# Patient Record
Sex: Female | Born: 1970 | Race: White | Hispanic: No | Smoking: Former smoker
Health system: Southern US, Community
[De-identification: ages and names within clinical notes are randomized; demographics above are authoritative.]

## PROBLEM LIST (undated history)

## (undated) DIAGNOSIS — Z8639 Personal history of other endocrine, nutritional and metabolic disease: Secondary | ICD-10-CM

## (undated) DIAGNOSIS — F988 Other specified behavioral and emotional disorders with onset usually occurring in childhood and adolescence: Secondary | ICD-10-CM

## (undated) DIAGNOSIS — F419 Anxiety disorder, unspecified: Secondary | ICD-10-CM

## (undated) DIAGNOSIS — M5412 Radiculopathy, cervical region: Secondary | ICD-10-CM

## (undated) HISTORY — DX: Other specified behavioral and emotional disorders with onset usually occurring in childhood and adolescence: F98.8

## (undated) HISTORY — DX: Radiculopathy, cervical region: M54.12

## (undated) HISTORY — DX: Anxiety disorder, unspecified: F41.9

## (undated) HISTORY — DX: Personal history of other endocrine, nutritional and metabolic disease: Z86.39

## (undated) HISTORY — PX: BREAST SURGERY: SHX581

---

## 2003-07-08 HISTORY — PX: BREAST SURGERY: SHX581

## 2011-05-02 ENCOUNTER — Emergency Department (HOSPITAL_COMMUNITY)
Admission: EM | Admit: 2011-05-02 | Discharge: 2011-05-02 | Disposition: A | Discharge status: Home / Self Care | Payer: Self-pay | Attending: Emergency Medicine | Admitting: Emergency Medicine

## 2011-05-02 DIAGNOSIS — R4589 Other symptoms and signs involving emotional state: Secondary | ICD-10-CM | POA: Insufficient documentation

## 2011-05-02 DIAGNOSIS — Z046 Encounter for general psychiatric examination, requested by authority: Secondary | ICD-10-CM | POA: Insufficient documentation

## 2011-05-02 LAB — CBC
MCH: 30.3 pg (ref 26.0–34.0)
Platelets: 256 10*3/uL (ref 150–400)
RBC: 3.9 MIL/uL (ref 3.87–5.11)
WBC: 7.4 10*3/uL (ref 4.0–10.5)

## 2011-05-02 LAB — COMPREHENSIVE METABOLIC PANEL
ALT: 12 U/L (ref 0–35)
AST: 19 U/L (ref 0–37)
Albumin: 3.6 g/dL (ref 3.5–5.2)
Chloride: 111 mEq/L (ref 96–112)
Creatinine, Ser: 0.58 mg/dL (ref 0.50–1.10)
Sodium: 145 mEq/L (ref 135–145)
Total Bilirubin: 0.1 mg/dL — ABNORMAL LOW (ref 0.3–1.2)

## 2011-05-02 LAB — RAPID URINE DRUG SCREEN, HOSP PERFORMED
Barbiturates: NOT DETECTED
Benzodiazepines: NOT DETECTED
Cocaine: NOT DETECTED
Tetrahydrocannabinol: NOT DETECTED

## 2011-05-02 LAB — ETHANOL: Alcohol, Ethyl (B): 176 mg/dL — ABNORMAL HIGH (ref 0–11)

## 2011-05-02 LAB — DIFFERENTIAL
Basophils Absolute: 0.1 10*3/uL (ref 0.0–0.1)
Basophils Relative: 2 % — ABNORMAL HIGH (ref 0–1)
Eosinophils Absolute: 0.9 10*3/uL — ABNORMAL HIGH (ref 0.0–0.7)
Monocytes Relative: 4 % (ref 3–12)
Neutro Abs: 3.7 10*3/uL (ref 1.7–7.7)
Neutrophils Relative %: 50 % (ref 43–77)

## 2011-07-12 ENCOUNTER — Encounter: Payer: Self-pay | Admitting: *Deleted

## 2011-07-12 ENCOUNTER — Emergency Department (INDEPENDENT_AMBULATORY_CARE_PROVIDER_SITE_OTHER): Payer: BC Managed Care – PPO

## 2011-07-12 ENCOUNTER — Other Ambulatory Visit: Payer: Self-pay

## 2011-07-12 ENCOUNTER — Emergency Department (HOSPITAL_BASED_OUTPATIENT_CLINIC_OR_DEPARTMENT_OTHER)
Admission: EM | Admit: 2011-07-12 | Discharge: 2011-07-12 | Disposition: A | Discharge status: Home / Self Care | Payer: BC Managed Care – PPO | Attending: Emergency Medicine | Admitting: Emergency Medicine

## 2011-07-12 DIAGNOSIS — R079 Chest pain, unspecified: Secondary | ICD-10-CM | POA: Insufficient documentation

## 2011-07-12 DIAGNOSIS — R0789 Other chest pain: Secondary | ICD-10-CM

## 2011-07-12 DIAGNOSIS — R209 Unspecified disturbances of skin sensation: Secondary | ICD-10-CM

## 2011-07-12 DIAGNOSIS — F17200 Nicotine dependence, unspecified, uncomplicated: Secondary | ICD-10-CM | POA: Insufficient documentation

## 2011-07-12 DIAGNOSIS — F410 Panic disorder [episodic paroxysmal anxiety] without agoraphobia: Secondary | ICD-10-CM | POA: Insufficient documentation

## 2011-07-12 LAB — BASIC METABOLIC PANEL
Calcium: 10 mg/dL (ref 8.4–10.5)
Chloride: 104 mEq/L (ref 96–112)
Creatinine, Ser: 0.7 mg/dL (ref 0.50–1.10)
GFR calc Af Amer: >90 mL/min (ref >90)

## 2011-07-12 LAB — DIFFERENTIAL
Basophils Absolute: 0.1 10*3/uL (ref 0.0–0.1)
Lymphocytes Relative: 25 % (ref 12–46)
Neutro Abs: 6 10*3/uL (ref 1.7–7.7)
Neutrophils Relative %: 63 % (ref 43–77)

## 2011-07-12 LAB — CBC
Platelets: 354 10*3/uL (ref 150–400)
RDW: 13.1 % (ref 11.5–15.5)
WBC: 9.4 10*3/uL (ref 4.0–10.5)

## 2011-07-12 LAB — ETHANOL: Alcohol, Ethyl (B): <11 mg/dL (ref 0–11)

## 2011-07-12 MED ORDER — LORAZEPAM 1 MG PO TABS: 1.0000 mg | ORAL_TABLET | Freq: Three times a day (TID) | ORAL | Status: AC | PRN

## 2011-07-12 MED ORDER — LORAZEPAM 2 MG/ML IJ SOLN
1.0000 mg | Freq: Once | INTRAMUSCULAR | Status: AC
  Administered 2011-07-12: 1 mg via INTRAVENOUS
  Filled 2011-07-12: qty 1

## 2011-07-12 NOTE — ED Notes (Signed)
Pt states she was sitting at home one hour ago and developed a sudden onset of midsternal CP, SHOB, and diaphoresis. No radiation. Hyperventilating at triage. Encouraged to slow breathing. Increased stress lately.

## 2011-07-12 NOTE — ED Notes (Signed)
Pt in radiology 

## 2011-07-12 NOTE — ED Provider Notes (Signed)
History   This chart was scribed for Susan Shi, MD by Sofie Rower. The patient was seen in room [facility] and the patient's care was started at 3:22PM.    CSN: [id number]  Arrival date & time 07/12/11  1456   First MD Initiated Contact with Patient 07/12/11 1515      Chief Complaint  Patient presents with  . Chest Pain    (Consider location/radiation/quality/duration/timing/severity/associated sxs/prior treatment) HPI  Susan Harrington is a 41 y.o. female who presents to the Emergency Department complaining of moderate, constant shortness of breath onset one hour ago with associated symptoms of hyperventilation, Pt is taking allegra and birth control pills. Pt denies any stresses, history of hyperventilation, recent surgery, history of blood clots, swelling.    History reviewed. No pertinent past medical history.  Past Surgical History  Procedure Date  . Cesarean section   . Breast surgery     History reviewed. No pertinent family history.  History  Substance Use Topics  . Smoking status: Current Some Day Smoker  . Smokeless tobacco: Not on file  . Alcohol Use: Yes    OB History    Grav Para Term Preterm Abortions TAB SAB Ect Mult Living                  Review of Systems  10 Systems reviewed and are negative for acute change except as noted in the HPI.   Allergies  Sulfa antibiotics  Home Medications   Current Outpatient Rx  Name Route Sig Dispense Refill  . ASPIRIN-ACETAMINOPHEN-CAFFEINE 250-250-65 MG PO TABS Oral Take 2 tablets by mouth every 6 (six) hours as needed. For pain     . FEXOFENADINE HCL 180 MG PO TABS Oral Take 180 mg by mouth daily.        BP 128/70  Pulse 90  Temp(Src) 97.4 F (36.3 C) (Oral)  Resp 36  Ht 5\' 8"  (1.727 m)  Wt 145 lb (65.772 kg)  BMI 22.05 kg/m2  SpO2 100%  LMP 07/10/2011  Physical Exam  Nursing note and vitals reviewed. Constitutional: She is oriented to person, place, and time. She appears well-developed and  well-nourished. No distress.  HENT:  Head: Normocephalic and atraumatic.  Eyes: EOM are normal. Pupils are equal, round, and reactive to light.  Neck: Normal range of motion. Neck supple. No tracheal deviation present.  Cardiovascular: Normal rate.   No murmur heard.        Date: 07/12/2011  Rate: 96  Rhythm: normal sinus rhythm  QRS Axis: normal  Intervals: QT prolonged  ST/T Wave abnormalities: normal  Conduction Disutrbances:none:   Old EKG Reviewed: none available     Pulmonary/Chest: Effort normal. No respiratory distress.       Hyperventilating.   Abdominal: Soft. Bowel sounds are normal. She exhibits no distension.  Musculoskeletal: Normal range of motion. She exhibits no edema.  Neurological: She is alert and oriented to person, place, and time. No sensory deficit.  Skin: Skin is warm and dry.  Psychiatric: Her behavior is normal.       Appears anxious.    ED Course  Procedures (including critical care time) After her 1 mg Ativan the patient was feeling some better.  Plants this time is to repeat the Ativan reevaluate. DIAGNOSTIC STUDIES: Oxygen Saturation is 100% on room air, normal by my interpretation.    COORDINATION OF CARE:    Results for orders placed during the hospital encounter of 07/12/11  BASIC METABOLIC PANEL  Component Value Range   Sodium 141  135 - 145 (mEq/L)   Potassium 3.7  3.5 - 5.1 (mEq/L)   Chloride 104  96 - 112 (mEq/L)   CO2 23  19 - 32 (mEq/L)   Glucose, Bld 93  70 - 99 (mg/dL)   BUN 11  6 - 23 (mg/dL)   Creatinine, Ser 1.61  0.50 - 1.10 (mg/dL)   Calcium 09.6  8.4 - 10.5 (mg/dL)   GFR calc non Af Amer >90  >90 (mL/min)   GFR calc Af Amer >90  >90 (mL/min)  CBC      Component Value Range   WBC 9.4  4.0 - 10.5 (K/uL)   RBC 4.61  3.87 - 5.11 (MIL/uL)   Hemoglobin 13.3  12.0 - 15.0 (g/dL)   HCT 04.5  40.9 - 81.1 (%)   MCV 87.0  78.0 - 100.0 (fL)   MCH 28.9  26.0 - 34.0 (pg)   MCHC 33.2  30.0 - 36.0 (g/dL)   RDW 91.4  78.2 -  95.6 (%)   Platelets 354  150 - 400 (K/uL)  DIFFERENTIAL      Component Value Range   Neutrophils Relative 63  43 - 77 (%)   Neutro Abs 6.0  1.7 - 7.7 (K/uL)   Lymphocytes Relative 25  12 - 46 (%)   Lymphs Abs 2.3  0.7 - 4.0 (K/uL)   Monocytes Relative 6  3 - 12 (%)   Monocytes Absolute 0.6  0.1 - 1.0 (K/uL)   Eosinophils Relative 5  0 - 5 (%)   Eosinophils Absolute 0.4  0.0 - 0.7 (K/uL)   Basophils Relative 2 (*) 0 - 1 (%)   Basophils Absolute 0.1  0.0 - 0.1 (K/uL)  ETHANOL      Component Value Range   Alcohol, Ethyl (B) <11  0 - 11 (mg/dL)  TROPONIN I      Component Value Range   Troponin I <0.30  <0.30 (ng/mL)   Dg Chest 2 View  07/12/2011  *RADIOLOGY REPORT*  Clinical Data: Midsternal chest pressure and bilateral hand and foot numbness.  CHEST - 2 VIEW  Comparison: None.  Findings: Normal sized heart.  Clear lungs.  Minimal diffuse peribronchial thickening.  Unremarkable bones.  Bilateral breast implants.  IMPRESSION: Minimal bronchitic changes.  Original Report Authenticated By: Darrol Angel, M.D.     Diagnosis: 1  panic attack   MDM      3:24PM-EDP at bedside discusses treatment plan.  I personally performed the services described in this documentation, which was scribed in my presence. The recorded information has been reviewed and considered.     Susan Shi, MD 07/12/11 [phone]

## 2020-11-19 ENCOUNTER — Ambulatory Visit (INDEPENDENT_AMBULATORY_CARE_PROVIDER_SITE_OTHER): Payer: BC Managed Care – PPO | Admitting: Neurology

## 2020-11-19 ENCOUNTER — Encounter: Payer: Self-pay | Admitting: Neurology

## 2020-11-19 ENCOUNTER — Telehealth: Payer: Self-pay | Admitting: Neurology

## 2020-11-19 VITALS — BP 105/76 | HR 84 | Ht 68.75 in | Wt 150.0 lb

## 2020-11-19 DIAGNOSIS — R200 Anesthesia of skin: Secondary | ICD-10-CM | POA: Diagnosis not present

## 2020-11-19 DIAGNOSIS — G959 Disease of spinal cord, unspecified: Secondary | ICD-10-CM

## 2020-11-19 DIAGNOSIS — M5412 Radiculopathy, cervical region: Secondary | ICD-10-CM | POA: Diagnosis not present

## 2020-11-19 DIAGNOSIS — R29898 Other symptoms and signs involving the musculoskeletal system: Secondary | ICD-10-CM

## 2020-11-19 DIAGNOSIS — M79602 Pain in left arm: Secondary | ICD-10-CM

## 2020-11-19 DIAGNOSIS — M4802 Spinal stenosis, cervical region: Secondary | ICD-10-CM

## 2020-11-19 MED ORDER — METHYLPREDNISOLONE 4 MG PO TBPK: ORAL_TABLET | ORAL | 1 refills | Status: AC

## 2020-11-19 NOTE — Telephone Encounter (Signed)
Scheduled at [facility] 11/21/20 30 mins MRI Cervical Spine wo contrast Dr. Valaria Good Berkley Harvey #[id number] exp. 11/19/20-05/17/21

## 2020-11-19 NOTE — Telephone Encounter (Signed)
Called [employer] [phone]) and spoke with Hubbard Hartshorn to get authorization for [phone] (MRI Cervical Spine). Hubbard Hartshorn was able to get the MRI approved. PA #[id number] (11/19/20- 05/17/21).

## 2020-11-19 NOTE — Progress Notes (Signed)
[facility]    Provider:  Dr Lucia Gaskins Requesting Provider: Shirlean Mylar, MD Primary Care Provider:  Shirlean Mylar, MD  CC:  Left arm pain  HPI:  Susan Harrington is a 50 y.o. female here as requested by Shirlean Mylar, MD for numbness and tingling of arm.  Past medical history hyperlipidemia, chronic fatigue, panic disorder, major depression, ADD.  I reviewed Dr. Marland Mcalpine notes, weakness and numbness progressive for 6 months, left arm, she is on gabapentin, she was also started on prednisone 9-day taper in February of this year for her left arm and shoulder which are painful and numb, she feels weak, ongoing for months but progressed over several weeks when she was last seen, very bothersome, she denied any injuries to her neck, no car accidents or falls, she is tried over-the-counter medication which is not helpful, she has had several appointments with Dr. Hyman Hopes.   She has pain starting last October, she has always had some neck pain and though she slept funny but has been continuous, progressive, it is severe now, she has been to Dr. Hyman Hopes who is primary care, tried OTc medications, prednisone, prednisone and tramadol help. Neck pain, radiation in to the left arm, radiates down the biceps and triceps to the forearm, mostly to digits 2-3, she has been to chiropractic, PT, ongoing > 6 months and progressive, numbness and tingling and weakness, dificulty performing ADLs and IADLs, failed conservative treatment, affecting life, been under the care of physicians for > 6 months now, unbearable pain.   Reviewed notes, labs and imaging from outside physicians, which showed: see above  Cbc/bmp normal (last in 2013, no recent labs to review)  Review of Systems: Patient complains of symptoms per HPI as well as the following symptoms: arm pain. Pertinent negatives and positives per HPI. All others negative.   Social History   Socioeconomic History  . Marital status: Divorced    Spouse name:  Not on file  . Number of children: Not on file  . Years of education: Not on file  . Highest education level: Not on file  Occupational History  . Not on file  Tobacco Use  . Smoking status: Former Games developer  . Smokeless tobacco: Never Used  Vaping Use  . Vaping Use: Never used  Substance and Sexual Activity  . Alcohol use: Not Currently  . Drug use: No  . Sexual activity: Yes    Birth control/protection: None  Other Topics Concern  . Not on file  Social History Narrative   Lives alone    Right handed   Caffeine: max 2 cups/day   Social Determinants of Health   Financial Resource Strain: Not on file  Food Insecurity: Not on file  Transportation Needs: Not on file  Physical Activity: Not on file  Stress: Not on file  Social Connections: Not on file  Intimate Partner Violence: Not on file    Family History  Problem Relation Age of Onset  . Breast cancer Mother   . Heart Problems Father     Past Medical History:  Diagnosis Date  . ADD (attention deficit disorder)    Dr Elisabeth Most at [facility]  . Anxiety   . Cervical radiculopathy   . H/O hyperlipidemia     Patient Active Problem List   Diagnosis Date Noted  . Cervical radiculopathy 11/19/2020    Past Surgical History:  Procedure Laterality Date  . BREAST SURGERY  2005   implants  . CESAREAN SECTION  1994  Current Outpatient Medications  Medication Sig Dispense Refill  . clonazePAM (KLONOPIN) 0.5 MG tablet Take 0.5 mg by mouth as needed for anxiety.    . fluticasone (FLONASE) 50 MCG/ACT nasal spray Place into both nostrils as needed for allergies or rhinitis.    . IBUPROFEN PO Take by mouth as needed. Alternates with Aleve    . loratadine (CLARITIN) 10 MG tablet Take 10 mg by mouth as needed for allergies.    . methylPREDNISolone (MEDROL DOSEPAK) 4 MG TBPK tablet Take pills daily in the morning with food for 6 days. 6-5-4-3-2-1 21 tablet 1  . Naproxen Sodium (ALEVE PO) Take by mouth  as needed. Alternates with Ibuprofen    . traMADol (ULTRAM) 50 MG tablet Take 50-100 mg by mouth daily as needed.     No current facility-administered medications for this visit.    Allergies as of 11/19/2020 - Review Complete 11/19/2020  Allergen Reaction Noted  . Septra [sulfamethoxazole-trimethoprim] Shortness Of Breath and Rash 11/19/2020  . Sulfa antibiotics Rash 07/12/2011    Vitals: BP 105/76 (BP Location: Right Arm, Patient Position: Sitting)   Pulse 84   Ht 5' 8.75" (1.746 m)   Wt 150 lb (68 kg)   BMI 22.31 kg/m  Last Weight:  Wt Readings from Last 1 Encounters:  11/19/20 150 lb (68 kg)   Last Height:   Ht Readings from Last 1 Encounters:  11/19/20 5' 8.75" (1.746 m)     Physical exam: Exam: Gen: NAD, conversant, well nourised, well groomed                     CV: RRR, no MRG. No Carotid Bruits. No peripheral edema, warm, nontender Eyes: Conjunctivae clear without exudates or hemorrhage  Neuro: Detailed Neurologic Exam  Speech:    Speech is normal; fluent and spontaneous with normal comprehension.  Cognition:    The patient is oriented to person, place, and time;     recent and remote memory intact;     language fluent;     normal attention, concentration,     fund of knowledge Cranial Nerves:    The pupils are equal, round, and reactive to light. The fundi are normal and spontaneous venous pulsations are present. Visual fields are full to finger confrontation. Extraocular movements are intact. Trigeminal sensation is intact and the muscles of mastication are normal. The face is symmetric. The palate elevates in the midline. Hearing intact. Voice is normal. Shoulder shrug is normal. The tongue has normal motion without fasciculations.   Coordination:    Normal finger to nose and heel to shin. Normal rapid alternating movements.   Gait:    Heel-toe and tandem gait are normal.   Motor Observation:    No asymmetry, no atrophy, and no involuntary movements  noted. Tone:    Normal muscle tone.    Posture:    Posture is normal. normal erect    Strength: left arm weakness proximally         Sensation: intact to LT     Reflex Exam:  DTR's: Reduced biceps/triceps reflexes left   Toes:    The toes are downgoing bilaterally.   Clonus:    Clonus is absent.    Assessment/Plan:  She has pain starting last October, she has always had some neck pain and though she slept funny but has been continuous, progressive, it is severe now, she has been to Dr. Hyman Hopes who is primary care, tried OTc medications, prednisone, prednisone and tramadol  help. Neck pain, radiation in to the left arm, radiates down the biceps and triceps to the forearm, mostly to digits 2-3, she has been to chiropractic, PT, ongoing > 6 months and progressive, numbness and tingling and weakness, dificulty performing ADLs and IADLs, failed conservative treatment, affecting life, been under the care of physicians for > 6 months now, unbearable pain.   Exam does show left prox weakness and reduced reflexes, likely c6/c7 radic need to also eval for stenosis, will likely need surgical intervention   Orders Placed This Encounter  Procedures  . MR CERVICAL SPINE WO CONTRAST   Meds ordered this encounter  Medications  . methylPREDNISolone (MEDROL DOSEPAK) 4 MG TBPK tablet    Sig: Take pills daily in the morning with food for 6 days. 6-5-4-3-2-1    Dispense:  21 tablet    Refill:  1    Cc: Shirlean Mylar, MD,  Shirlean Mylar, MD  Naomie Dean, MD  [facility] [address] [place], [place] [address]  Phone [phone] Fax [phone]

## 2020-11-19 NOTE — Patient Instructions (Addendum)
Cervical Radiculopathy  Cervical radiculopathy happens when a nerve in the neck (a cervical nerve) is pinched or bruised. This condition can happen because of an injury to the cervical spine (vertebrae) in the neck, or as part of the normal aging process. Pressure on the cervical nerves can cause pain or numbness that travels from the neck all the way down into the arm and fingers. Usually, this condition gets better with rest. Treatment may be needed if the condition does not improve. What are the causes? This condition may be caused by:  A neck injury.  A bulging (herniated) disk.  Muscle spasms.  Muscle tightness in the neck because of overuse.  Arthritis.  Breakdown or degeneration in the bones and joints of the spine (spondylosis) due to aging.  Bone spurs that may develop near the cervical nerves. What are the signs or symptoms? Symptoms of this condition include:  Pain. The pain may travel from the neck to the arm and hand. The pain can be severe or irritating. It may be worse when you move your neck.  Numbness or tingling in your arm or hand.  Weakness in the affected arm and hand, in severe cases. How is this diagnosed? This condition may be diagnosed based on your symptoms, your medical history, and a physical exam. You may also have tests, including:  X-rays.  A CT scan.  An MRI.  An electromyogram (EMG).  Nerve conduction tests. How is this treated? In many cases, treatment is not needed for this condition. With rest, the condition usually gets better over time. If treatment is needed, options may include:  Wearing a soft neck collar (cervical collar) for short periods of time, as told by your health care provider.  Doing physical therapy to strengthen your neck muscles.  Taking medicines, such as NSAIDs or oral corticosteroids.  Having spinal injections, in severe cases.  Having surgery. This may be needed if other treatments do not help. Different  types of surgery may be done depending on the cause of this condition. Follow these instructions at home: If you have a cervical collar:  Wear it as told by your health care provider. Remove it only as told by your health care provider.  Ask your health care provider if you can remove the collar for cleaning and bathing. If you are allowed to remove the collar for cleaning or bathing: ? Follow instructions from your health care provider about how to remove the collar safely. ? Clean the collar by wiping it with mild soap and water and drying it completely. ? Take out any removable pads in the collar every 1-2 days, and wash them by hand with soap and water. Let them air-dry completely before you put them back in the collar. ? Check your skin under the collar for irritation or sores. If you see any, tell your health care provider. Managing pain  Take over-the-counter and prescription medicines only as told by your health care provider.  If directed, put ice on the affected area. ? If you have a soft neck collar, remove it as told by your health care provider. ? Put ice in a plastic bag. ? Place a towel between your skin and the bag. ? Leave the ice on for 20 minutes, 2-3 times a day.  If applying ice does not help, you can try using heat. Use the heat source that your health care provider recommends, such as a moist heat pack or a heating pad. ? Place a  towel between your skin and the heat source. ? Leave the heat on for 20-30 minutes. ? Remove the heat if your skin turns bright red. This is especially important if you are unable to feel pain, heat, or cold. You may have a greater risk of getting burned.  Try a gentle neck and shoulder massage to help relieve symptoms.      Activity  Rest as needed.  Return to your normal activities as told by your health care provider. Ask your health care provider what activities are safe for you.  Do stretching and strengthening exercises as told  by your health care provider or physical therapist.  Do not lift anything that is heavier than 10 lb (4.5 kg) until your health care provider tells you that it is safe. General instructions  Use a flat pillow when you sleep.  Do not drive while wearing a cervical collar. If you do not have a cervical collar, ask your health care provider if it is safe to drive while your neck heals.  Ask your health care provider if the medicine prescribed to you requires you to avoid driving or using heavy machinery.  Do not use any products that contain nicotine or tobacco, such as cigarettes, e-cigarettes, and chewing tobacco. These can delay healing. If you need help quitting, ask your health care provider.  Keep all follow-up visits as told by your health care provider. This is important. Contact a health care provider if:  Your condition does not improve with treatment. Get help right away if:  Your pain gets much worse and cannot be controlled with medicines.  You have weakness or numbness in your hand, arm, face, or leg.  You have a high fever.  You have a stiff, rigid neck.  You lose control of your bowels or your bladder (have incontinence).  You have trouble with walking, balance, or speaking. Summary  Cervical radiculopathy happens when a nerve in the neck is pinched or bruised.  A nerve can get pinched from a bulging disk, arthritis, muscle spasms, or an injury to the neck.  Symptoms include pain, tingling, or numbness radiating from the neck into the arm or hand. Weakness can also occur in severe cases.  Treatment may include rest, wearing a cervical collar, and physical therapy. Medicines may be prescribed to help with pain. In severe cases, injections or surgery may be needed. This information is not intended to replace advice given to you by your health care provider. Make sure you discuss any questions you have with your health care provider. Document Revised: 05/14/2018  Document Reviewed: 05/14/2018 Elsevier Patient Education  2021 [employer] Methylprednisolone tablets What is this medicine? METHYLPREDNISOLONE (meth ill pred NISS oh lone) is a corticosteroid. It is commonly used to treat inflammation of the skin, joints, lungs, and other organs. Common conditions treated include asthma, allergies, and arthritis. It is also used for other conditions, such as blood disorders and diseases of the adrenal glands. This medicine may be used for other purposes; ask your health care provider or pharmacist if you have questions. COMMON BRAND NAME(S): Medrol, Medrol Dosepak What should I tell my health care provider before I take this medicine? They need to know if you have any of these conditions:  Cushing's syndrome  eye disease, vision problems  diabetes  glaucoma  heart disease  high blood pressure  infection (especially a virus infection such as chickenpox, cold sores, or herpes)  liver disease  mental illness  myasthenia gravis  osteoporosis  recently received or scheduled to receive a vaccine  seizures  stomach or intestine problems  thyroid disease  an unusual or allergic reaction to lactose, methylprednisolone, other medicines, foods, dyes, or preservatives  pregnant or trying to get pregnant  breast-feeding How should I use this medicine? Take this medicine by mouth with a glass of water. Follow the directions on the prescription label. Take this medicine with food. If you are taking this medicine once a day, take it in the morning. Do not take it more often than directed. Do not suddenly stop taking your medicine because you may develop a severe reaction. Your doctor will tell you how much medicine to take. If your doctor wants you to stop the medicine, the dose may be slowly lowered over time to avoid any side effects. Talk to your pediatrician regarding the use of this medicine in children. Special care may be  needed. Overdosage: If you think you have taken too much of this medicine contact a poison control center or emergency room at once. NOTE: This medicine is only for you. Do not share this medicine with others. What if I miss a dose? If you miss a dose, take it as soon as you can. If it is almost time for your next dose, talk to your doctor or health care professional. You may need to miss a dose or take an extra dose. Do not take double or extra doses without advice. What may interact with this medicine? Do not take this medicine with any of the following medications:  alefacept  echinacea  live virus vaccines  metyrapone  mifepristone This medicine may also interact with the following medications:  amphotericin B  aspirin and aspirin-like medicines  certain antibiotics like erythromycin, clarithromycin, troleandomycin  certain medicines for diabetes  certain medicines for fungal infections like ketoconazole  certain medicines for seizures like carbamazepine, phenobarbital, phenytoin  certain medicines that treat or prevent blood clots like warfarin  cholestyramine  cyclosporine  digoxin  diuretics  female hormones, like estrogens and birth control pills  isoniazid  NSAIDs, medicines for pain inflammation, like ibuprofen or naproxen  other medicines for myasthenia gravis  rifampin  vaccines This list may not describe all possible interactions. Give your health care provider a list of all the medicines, herbs, non-prescription drugs, or dietary supplements you use. Also tell them if you smoke, drink alcohol, or use illegal drugs. Some items may interact with your medicine. What should I watch for while using this medicine? Tell your doctor or healthcare professional if your symptoms do not start to get better or if they get worse. Do not stop taking except on your doctor's advice. You may develop a severe reaction. Your doctor will tell you how much medicine to  take. This medicine may increase your risk of getting an infection. Tell your doctor or health care professional if you are around anyone with measles or chickenpox, or if you develop sores or blisters that do not heal properly. This medicine may increase blood sugar levels. Ask your healthcare provider if changes in diet or medicines are needed if you have diabetes. Tell your doctor or health care professional right away if you have any change in your eyesight. Using this medicine for a long time may increase your risk of low bone mass. Talk to your doctor about bone health. What side effects may I notice from receiving this medicine? Side effects that you should report to your doctor or health care professional as  soon as possible:  allergic reactions like skin rash, itching or hives, swelling of the face, lips, or tongue  bloody or tarry stools  hallucination, loss of contact with reality  muscle cramps  muscle pain  palpitations  signs and symptoms of high blood sugar such as being more thirsty or hungry or having to urinate more than normal. You may also feel very tired or have blurry vision.  signs and symptoms of infection like fever or chills; cough; sore throat; pain or trouble passing urine Side effects that usually do not require medical attention (report to your doctor or health care professional if they continue or are bothersome):  changes in emotions or mood  constipation  diarrhea  excessive hair growth on the face or body  headache  nausea, vomiting  trouble sleeping  weight gain This list may not describe all possible side effects. Call your doctor for medical advice about side effects. You may report side effects to FDA at 1-800-FDA-1088. Where should I keep my medicine? Keep out of the reach of children. Store at room temperature between 20 and 25 degrees C (68 and 77 degrees F). Throw away any unused medicine after the expiration date. NOTE: This sheet  is a summary. It may not cover all possible information. If you have questions about this medicine, talk to your doctor, pharmacist, or health care provider.  2021 Elsevier/Gold Standard (2018-03-25 09:19:36)

## 2020-11-21 ENCOUNTER — Ambulatory Visit (INDEPENDENT_AMBULATORY_CARE_PROVIDER_SITE_OTHER): Payer: BC Managed Care – PPO

## 2020-11-21 DIAGNOSIS — R200 Anesthesia of skin: Secondary | ICD-10-CM

## 2020-11-21 DIAGNOSIS — M5412 Radiculopathy, cervical region: Secondary | ICD-10-CM

## 2020-11-21 DIAGNOSIS — R29898 Other symptoms and signs involving the musculoskeletal system: Secondary | ICD-10-CM

## 2020-11-21 DIAGNOSIS — M4802 Spinal stenosis, cervical region: Secondary | ICD-10-CM

## 2020-11-21 DIAGNOSIS — M79602 Pain in left arm: Secondary | ICD-10-CM | POA: Diagnosis not present

## 2020-11-21 DIAGNOSIS — G959 Disease of spinal cord, unspecified: Secondary | ICD-10-CM | POA: Diagnosis not present

## 2020-11-22 ENCOUNTER — Telehealth: Payer: Self-pay | Admitting: *Deleted

## 2020-11-22 NOTE — Telephone Encounter (Signed)
-----   Message from Anson Fret, MD sent at 11/22/2020 12:31 PM EDT ----- Severe left nerve pinching at c6/c7 just like we thought during her appointment. She needs to see a Careers adviser. I can refer her to [facility] if she is willing. Let me know and I can place the order to dr Venetia Maxon and text his PA.

## 2020-11-22 NOTE — Telephone Encounter (Addendum)
Called Susan Harrington and LVM (ok per DPR) advising Susan Harrington of MRI cervical spine results as noted below by Dr. Lucia Gaskins.  I asked the patient to give [place] a call and let us know if she is okay with being referred to neurosurgery. Left office number in message.

## 2020-11-27 ENCOUNTER — Other Ambulatory Visit: Payer: Self-pay | Admitting: Neurology

## 2020-11-27 DIAGNOSIS — M5412 Radiculopathy, cervical region: Secondary | ICD-10-CM

## 2020-11-27 NOTE — Telephone Encounter (Signed)
Faxed referral to [facility] for Dr. Venetia Maxon. Phone: [phone]. Fax: [phone].

## 2020-11-27 NOTE — Telephone Encounter (Addendum)
I called the patient again and LVM (ok per DPR) with MRI cervical spine results. Again I asked for the patient to call us back and let us know if she is okay with the referral to Dr. Venetia Maxon.  Left office number in message.   Will try to call patient 1 more time on a different day and then send letter if we do not reach her.

## 2020-11-29 NOTE — Telephone Encounter (Signed)
I called the pt and LVM again regarding the MRI results and referral to neurosurgery. I also reached out to the daughter Natalia Leatherwood (on [place]) and discussed the MRI results and neurosurgery referral with her. She will have the patient give [place] a call and be on the lookout for a call from [facility]. She verbalized appreciation for the call.

## 2020-12-04 NOTE — Telephone Encounter (Signed)
Patient left a voicemail today asking for a call to discuss her MRI results.  She understands that she was referred to a neurosurgeon but would like to discuss this first.  Please call.

## 2020-12-05 NOTE — Telephone Encounter (Signed)
I called the patient back and LVM (ok per DPR) asking for call back.  Left office number and hours in message.

## 2020-12-06 NOTE — Telephone Encounter (Signed)
I placed referral, will add taylor on here to make sure it went there. I did not contact Dr. Venetia Maxon, first available surgeon is fine for patient. thanks

## 2020-12-06 NOTE — Telephone Encounter (Signed)
Spoke with patient and discussed the MRI C-spine findings of pinched nerve at C5/6.  The patient verbalized understanding and she is okay with moving forward with a referral to neurosurgery to discuss treatment options.  I gave her the number to call the surgeon's office and advised the referral was sent over already.  I answered her questions.  She verbalized understanding and appreciation for the call.

## 2021-02-01 ENCOUNTER — Other Ambulatory Visit: Payer: Self-pay | Admitting: Neurology

## 2021-11-20 ENCOUNTER — Other Ambulatory Visit: Payer: Self-pay | Admitting: Family Medicine

## 2021-11-20 DIAGNOSIS — Z1231 Encounter for screening mammogram for malignant neoplasm of breast: Secondary | ICD-10-CM

## 2021-11-22 ENCOUNTER — Ambulatory Visit
Admission: RE | Admit: 2021-11-22 | Discharge: 2021-11-22 | Disposition: A | Discharge status: Home / Self Care | Payer: BC Managed Care – PPO | Source: Ambulatory Visit | Attending: Family Medicine | Admitting: Family Medicine

## 2021-11-22 DIAGNOSIS — Z1231 Encounter for screening mammogram for malignant neoplasm of breast: Secondary | ICD-10-CM

## 2021-11-26 ENCOUNTER — Other Ambulatory Visit: Payer: Self-pay | Admitting: Family Medicine

## 2021-11-26 DIAGNOSIS — R928 Other abnormal and inconclusive findings on diagnostic imaging of breast: Secondary | ICD-10-CM

## 2021-11-29 ENCOUNTER — Ambulatory Visit
Admission: RE | Admit: 2021-11-29 | Discharge: 2021-11-29 | Disposition: A | Discharge status: Home / Self Care | Payer: BC Managed Care – PPO | Source: Ambulatory Visit | Attending: Family Medicine | Admitting: Family Medicine

## 2021-11-29 DIAGNOSIS — R928 Other abnormal and inconclusive findings on diagnostic imaging of breast: Secondary | ICD-10-CM

## 2021-12-04 ENCOUNTER — Other Ambulatory Visit: Payer: BC Managed Care – PPO

## 2022-02-24 ENCOUNTER — Other Ambulatory Visit: Payer: Self-pay | Admitting: Family Medicine

## 2022-02-24 ENCOUNTER — Other Ambulatory Visit (HOSPITAL_BASED_OUTPATIENT_CLINIC_OR_DEPARTMENT_OTHER): Payer: Self-pay | Admitting: Family Medicine

## 2022-02-24 DIAGNOSIS — E7800 Pure hypercholesterolemia, unspecified: Secondary | ICD-10-CM

## 2022-03-27 ENCOUNTER — Ambulatory Visit (HOSPITAL_BASED_OUTPATIENT_CLINIC_OR_DEPARTMENT_OTHER)
Admission: RE | Admit: 2022-03-27 | Discharge: 2022-03-27 | Disposition: A | Discharge status: Home / Self Care | Payer: BC Managed Care – PPO | Source: Ambulatory Visit | Attending: Family Medicine | Admitting: Family Medicine

## 2022-03-27 DIAGNOSIS — E7800 Pure hypercholesterolemia, unspecified: Secondary | ICD-10-CM | POA: Insufficient documentation

## 2022-11-26 ENCOUNTER — Other Ambulatory Visit: Payer: Self-pay | Admitting: Family Medicine

## 2022-11-26 DIAGNOSIS — E2839 Other primary ovarian failure: Secondary | ICD-10-CM

## 2022-12-03 ENCOUNTER — Other Ambulatory Visit: Payer: Self-pay | Admitting: Family Medicine

## 2022-12-03 DIAGNOSIS — Z1231 Encounter for screening mammogram for malignant neoplasm of breast: Secondary | ICD-10-CM

## 2022-12-19 ENCOUNTER — Ambulatory Visit: Payer: BC Managed Care – PPO

## 2022-12-31 ENCOUNTER — Other Ambulatory Visit: Payer: Self-pay | Admitting: Family Medicine

## 2022-12-31 DIAGNOSIS — Z1231 Encounter for screening mammogram for malignant neoplasm of breast: Secondary | ICD-10-CM

## 2023-01-10 IMAGING — US US BREAST*R* LIMITED INC AXILLA
1 series · 4 of 4 positions shown · non-contrast
Comparison: Previous exams including recent screening mammogram
dated 11/22/2021.

CLINICAL DATA: Patient returns today to evaluate a possible RIGHT
breast mass questioned on recent screening mammogram.

EXAM:
DIGITAL DIAGNOSTIC UNILATERAL RIGHT MAMMOGRAM WITH IMPLANTS, CAD AND
TOMOSYNTHESIS; ULTRASOUND RIGHT BREAST LIMITED
TECHNIQUE: Right digital diagnostic mammography and breast tomosynthesis was
performed. The images were evaluated with computer-aided detection.
Standard and/or implant displaced views were performed.; Targeted
ultrasound examination of the right breast was performed

[Series 1: us breast*right* limited inc axilla · 0.07mm/px · 4 of 4 slices shown]
[im 1/4]
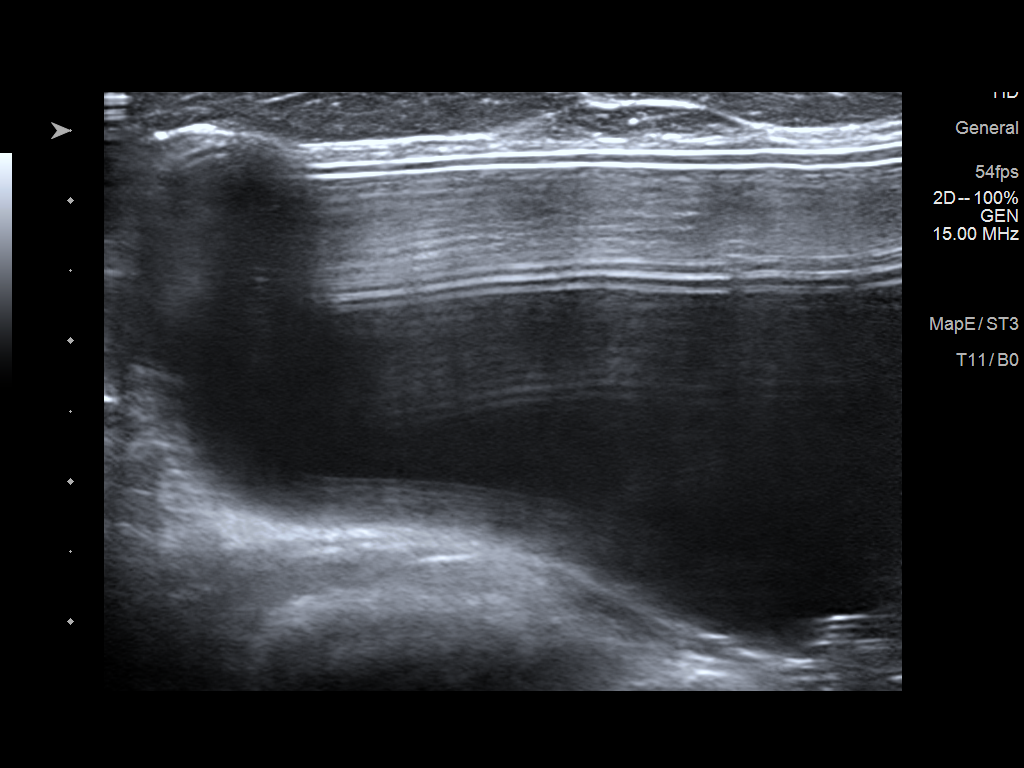
[im 2/4]
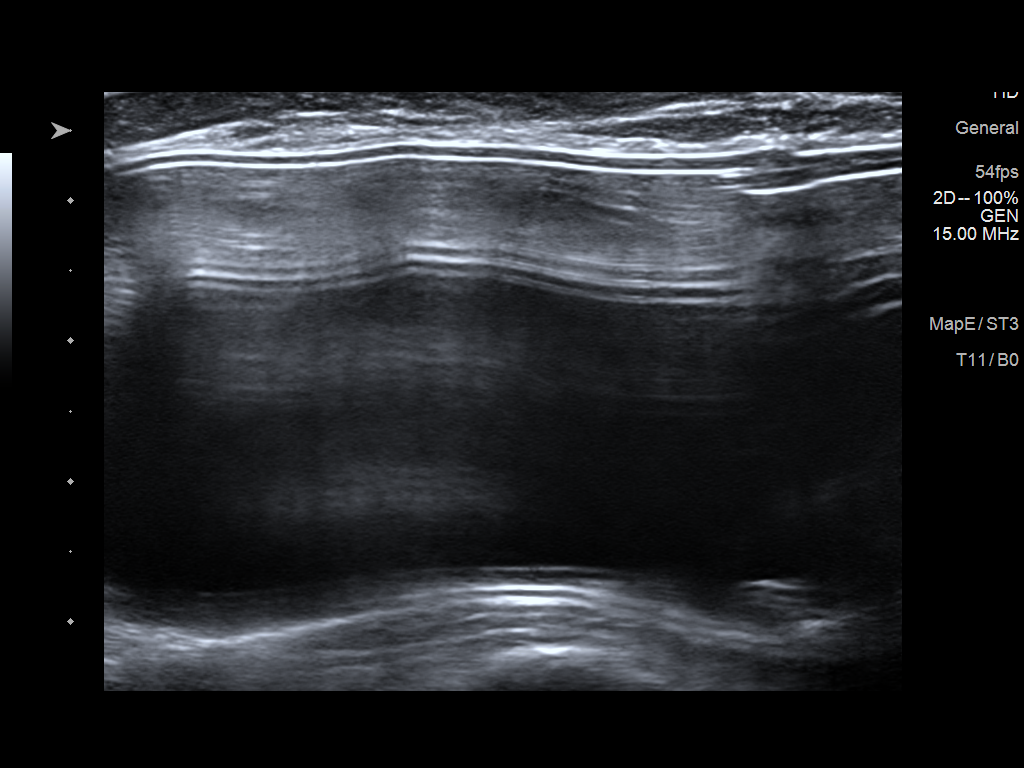
[im 3/4]
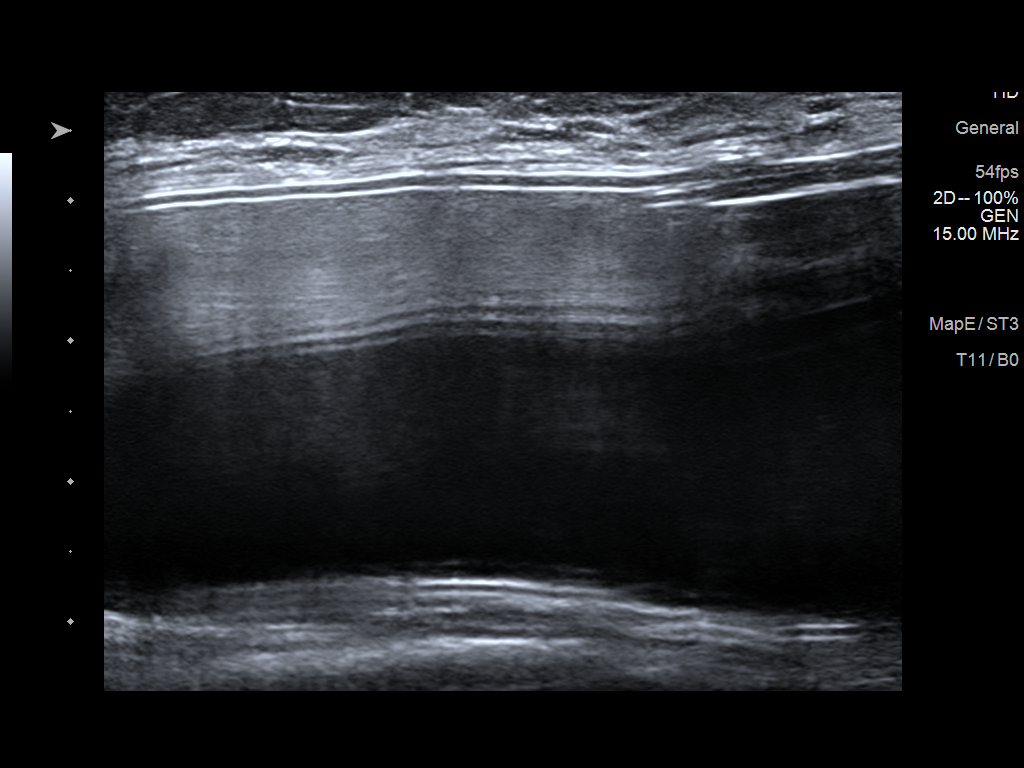
[im 4/4]
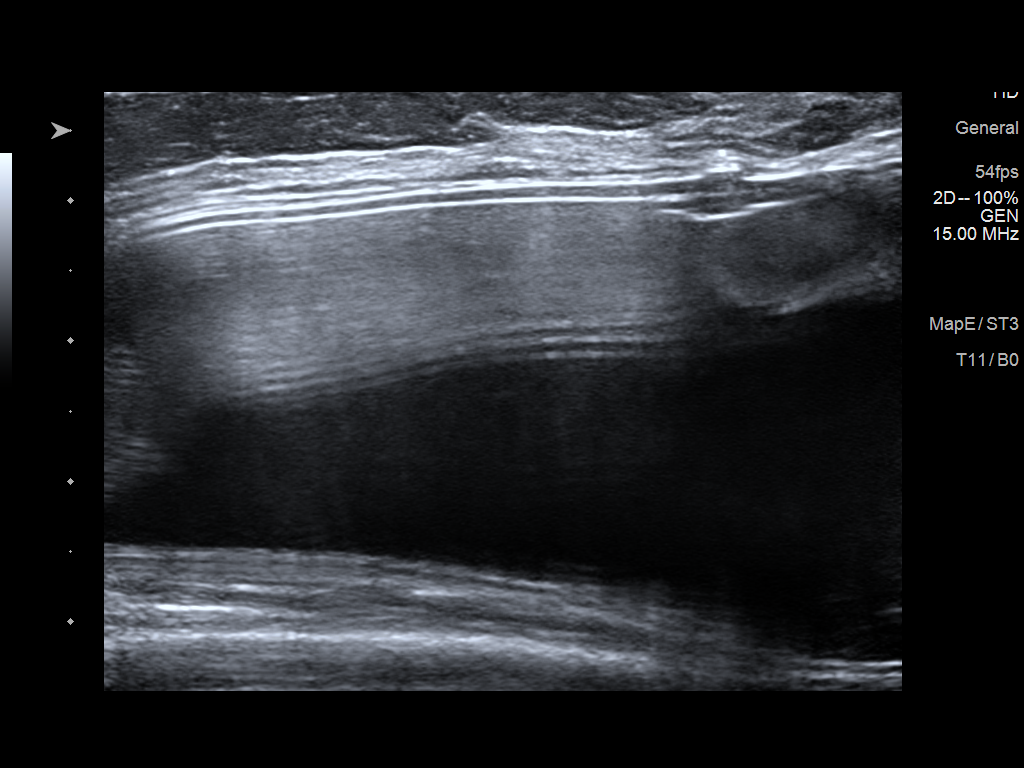

[4 of 4 positions shown; findings below may reference images not displayed]

ACR Breast Density Category b: There are scattered areas of
fibroglandular density.
FINDINGS: On today's additional diagnostic views with spot compression and 3D
tomosynthesis, the questioned asymmetry within the outer periareolar
RIGHT breast is most compatible with a ridge of normal dense
fibroglandular tissue and the overall fibroglandular pattern of the
periareolar RIGHT breast on today's study is stable compared to
patient's earliest screening mammogram of 01/31/2016 suggesting
benignity. Ultrasound will be performed to ensure benignity. The
patient has retropectoral implants.

Targeted ultrasound is performed, evaluating outer periareolar RIGHT
breast, showing only normal fibroglandular tissues and fat lobules
throughout. No solid or cystic mass.
IMPRESSION: No evidence of malignancy.

Patient may return to routine annual bilateral screening mammogram
schedule.

RECOMMENDATION:
Screening mammogram in one year.(Code:[id number]-[id number])

I have discussed the findings and recommendations with the patient.
If applicable, a reminder letter will be sent to the patient
regarding the next appointment.

BI-RADS CATEGORY  1: Negative.

## 2023-01-10 IMAGING — MG MM DIGITAL DIAGNOSTIC UNILAT*R* IMPLANT W/ TOMO W/ CAD
4 series · 4 of 12 positions shown · non-contrast
Comparison: Previous exams including recent screening mammogram
dated 11/22/2021.

CLINICAL DATA: Patient returns today to evaluate a possible RIGHT
breast mass questioned on recent screening mammogram.

EXAM:
DIGITAL DIAGNOSTIC UNILATERAL RIGHT MAMMOGRAM WITH IMPLANTS, CAD AND
TOMOSYNTHESIS; ULTRASOUND RIGHT BREAST LIMITED
TECHNIQUE: Right digital diagnostic mammography and breast tomosynthesis was
performed. The images were evaluated with computer-aided detection.
Standard and/or implant displaced views were performed.; Targeted
ultrasound examination of the right breast was performed

[R CC synth-2D]
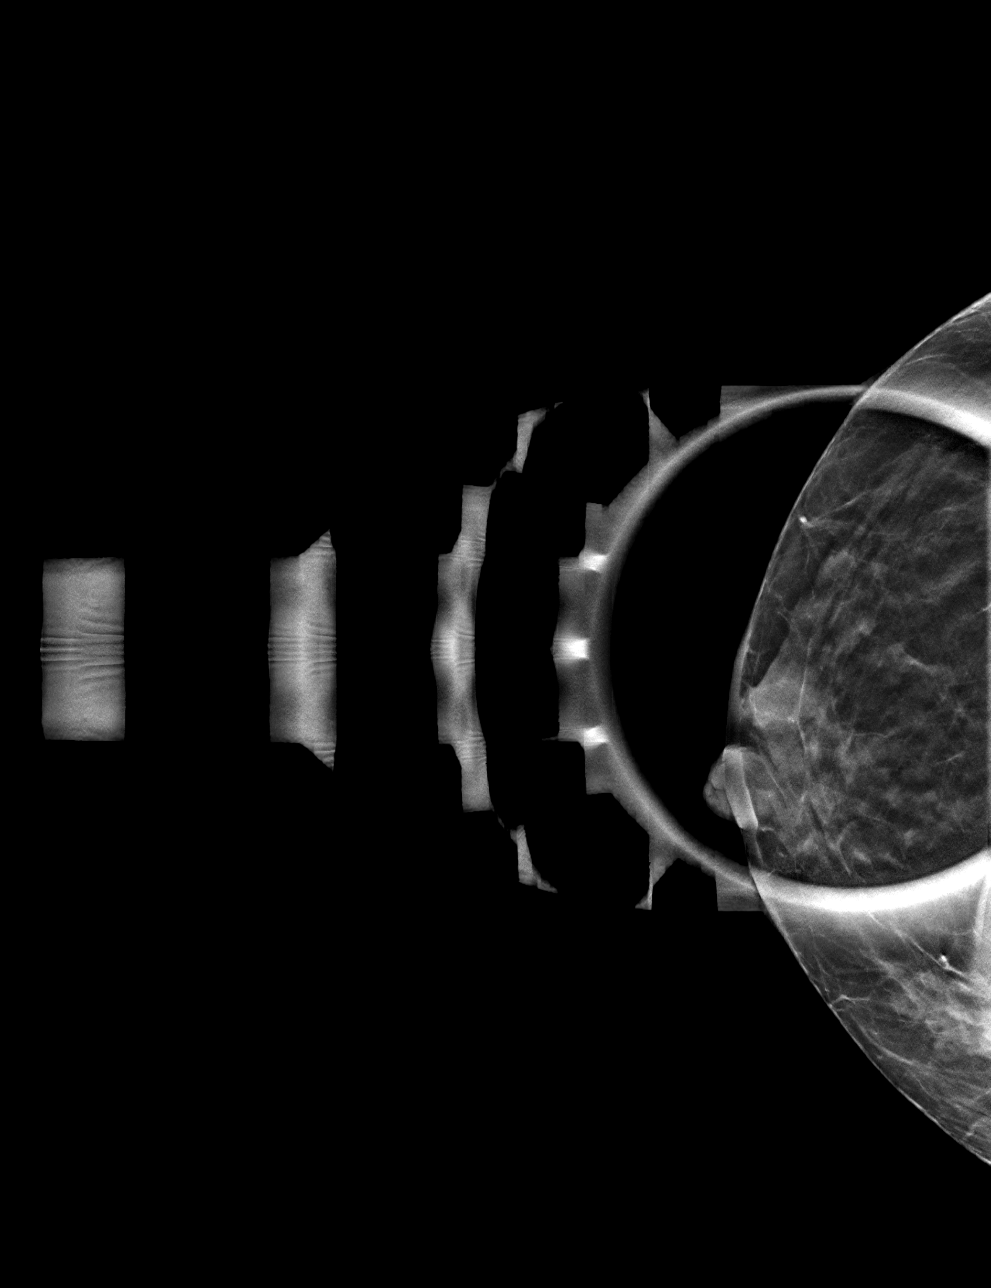

[R MLO synth-2D]
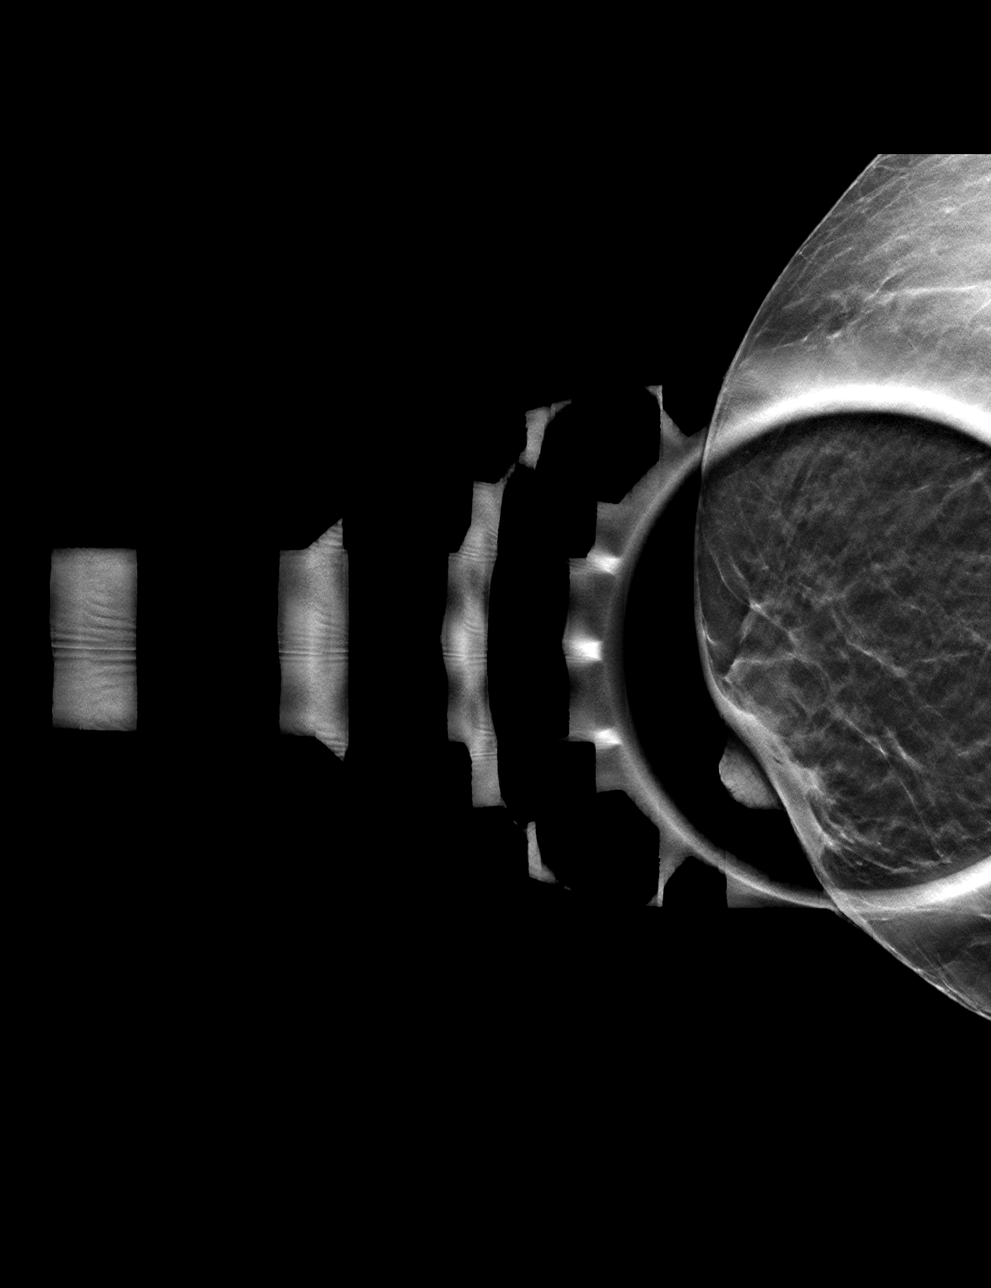

[R CCID BREAST TOMOSYNTHESIS IMAGE tomo · tomo slice 15/28.0]
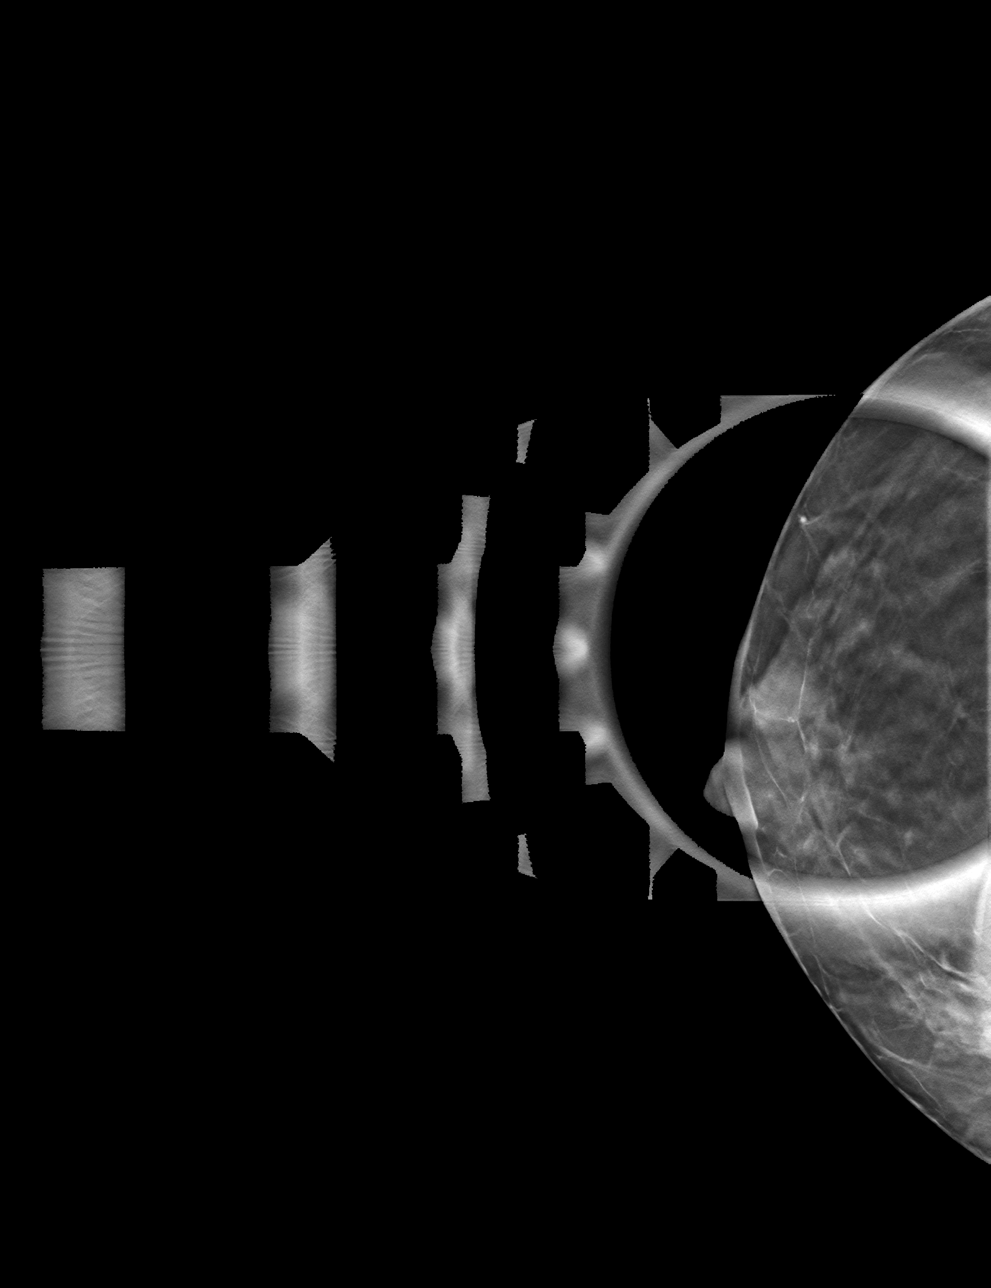

[R MLOID BREAST TOMOSYNTHESIS IMAGE tomo · tomo slice 13/26.0]
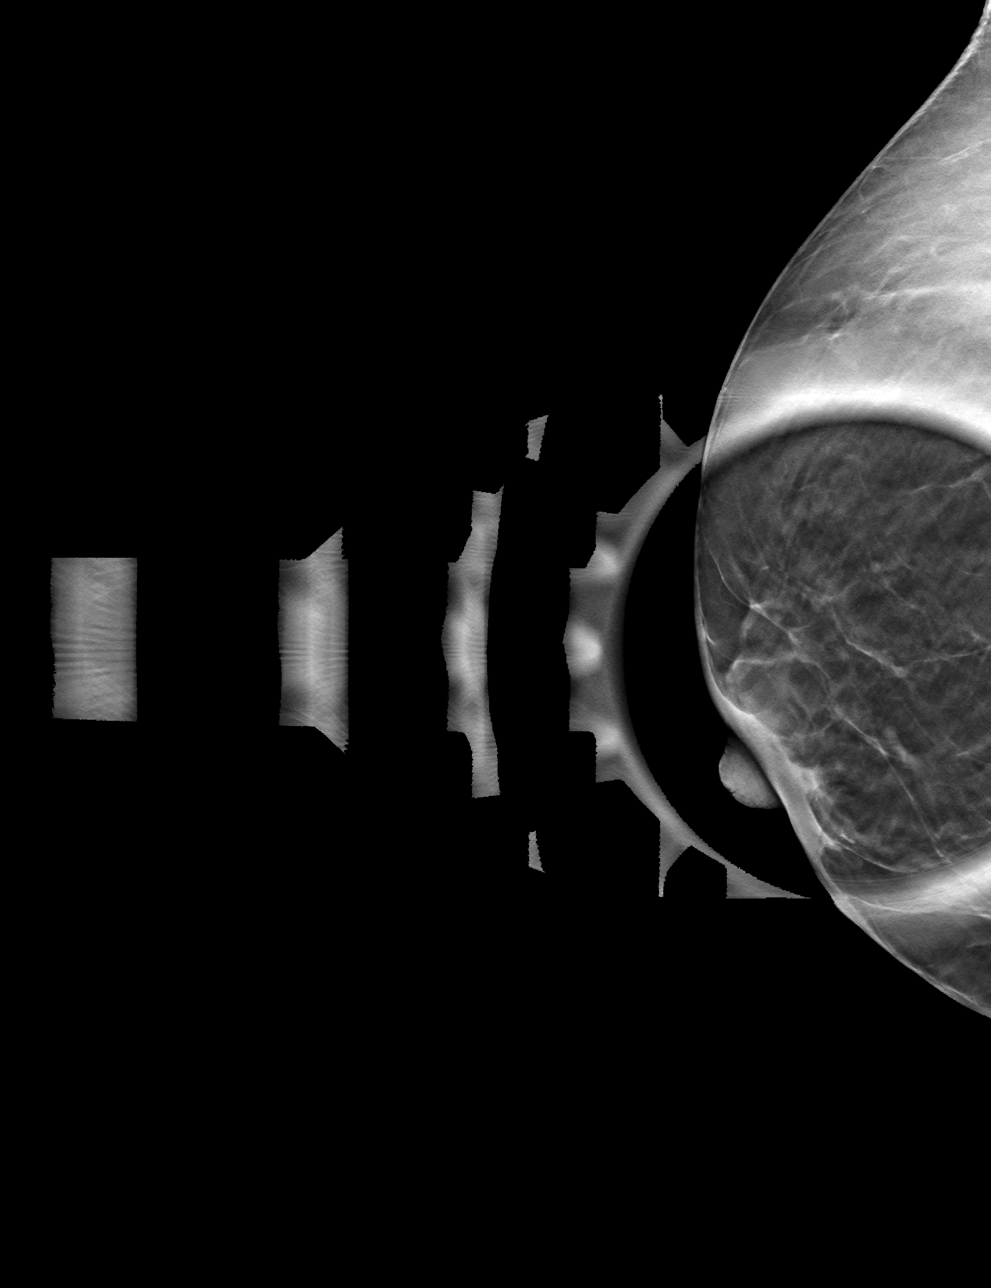

[4 of 12 positions shown; findings below may reference images not displayed]

ACR Breast Density Category b: There are scattered areas of
fibroglandular density.
FINDINGS: On today's additional diagnostic views with spot compression and 3D
tomosynthesis, the questioned asymmetry within the outer periareolar
RIGHT breast is most compatible with a ridge of normal dense
fibroglandular tissue and the overall fibroglandular pattern of the
periareolar RIGHT breast on today's study is stable compared to
patient's earliest screening mammogram of 01/31/2016 suggesting
benignity. Ultrasound will be performed to ensure benignity. The
patient has retropectoral implants.

Targeted ultrasound is performed, evaluating outer periareolar RIGHT
breast, showing only normal fibroglandular tissues and fat lobules
throughout. No solid or cystic mass.
IMPRESSION: No evidence of malignancy.

Patient may return to routine annual bilateral screening mammogram
schedule.

RECOMMENDATION:
Screening mammogram in one year.(Code:[id number])

I have discussed the findings and recommendations with the patient.
If applicable, a reminder letter will be sent to the patient
regarding the next appointment.

BI-RADS CATEGORY  1: Negative.

## 2023-01-23 ENCOUNTER — Ambulatory Visit
Admission: RE | Admit: 2023-01-23 | Discharge: 2023-01-23 | Disposition: A | Discharge status: Home / Self Care | Payer: BC Managed Care – PPO | Source: Ambulatory Visit | Attending: Family Medicine | Admitting: Family Medicine

## 2023-01-23 DIAGNOSIS — Z1231 Encounter for screening mammogram for malignant neoplasm of breast: Secondary | ICD-10-CM

## 2023-04-24 ENCOUNTER — Other Ambulatory Visit: Payer: Self-pay | Admitting: Family Medicine

## 2023-04-24 DIAGNOSIS — E2839 Other primary ovarian failure: Secondary | ICD-10-CM

## 2023-07-21 ENCOUNTER — Ambulatory Visit (HOSPITAL_BASED_OUTPATIENT_CLINIC_OR_DEPARTMENT_OTHER): Payer: Self-pay

## 2023-07-31 ENCOUNTER — Ambulatory Visit (HOSPITAL_BASED_OUTPATIENT_CLINIC_OR_DEPARTMENT_OTHER)
Admission: RE | Admit: 2023-07-31 | Discharge: 2023-07-31 | Disposition: A | Discharge status: Home / Self Care | Payer: 59 | Source: Ambulatory Visit | Attending: Family Medicine | Admitting: Family Medicine

## 2023-07-31 DIAGNOSIS — E2839 Other primary ovarian failure: Secondary | ICD-10-CM | POA: Diagnosis present

## 2023-11-11 ENCOUNTER — Other Ambulatory Visit: Payer: Self-pay | Admitting: Family Medicine

## 2023-11-11 DIAGNOSIS — Z1231 Encounter for screening mammogram for malignant neoplasm of breast: Secondary | ICD-10-CM

## 2023-11-27 ENCOUNTER — Other Ambulatory Visit: Payer: BC Managed Care – PPO

## 2024-01-25 ENCOUNTER — Ambulatory Visit
Admission: RE | Admit: 2024-01-25 | Discharge: 2024-01-25 | Disposition: A | Discharge status: Home / Self Care | Source: Ambulatory Visit | Attending: Family Medicine | Admitting: Family Medicine

## 2024-01-25 DIAGNOSIS — Z1231 Encounter for screening mammogram for malignant neoplasm of breast: Secondary | ICD-10-CM
